# Patient Record
Sex: Female | Born: 1965 | Race: White | Hispanic: No
Health system: Southern US, Community
[De-identification: ages and names within clinical notes are randomized; demographics above are authoritative.]

---

## 1999-02-11 ENCOUNTER — Encounter: Payer: Self-pay | Admitting: Emergency Medicine

## 1999-02-11 ENCOUNTER — Emergency Department (HOSPITAL_COMMUNITY): Admission: EM | Admit: 1999-02-11 | Discharge: 1999-02-11 | Payer: Self-pay | Admitting: Emergency Medicine

## 2004-08-17 ENCOUNTER — Ambulatory Visit: Payer: Self-pay | Admitting: Obstetrics and Gynecology

## 2004-08-30 ENCOUNTER — Ambulatory Visit: Payer: Self-pay | Admitting: Obstetrics and Gynecology

## 2005-09-18 ENCOUNTER — Ambulatory Visit: Payer: Self-pay | Admitting: Obstetrics and Gynecology

## 2006-08-21 ENCOUNTER — Ambulatory Visit: Payer: Self-pay | Admitting: Gastroenterology

## 2006-10-02 ENCOUNTER — Ambulatory Visit: Payer: Self-pay | Admitting: Obstetrics and Gynecology

## 2006-10-08 ENCOUNTER — Ambulatory Visit: Payer: Self-pay | Admitting: Obstetrics and Gynecology

## 2007-10-06 ENCOUNTER — Ambulatory Visit: Payer: Self-pay | Admitting: Obstetrics and Gynecology

## 2008-10-06 ENCOUNTER — Ambulatory Visit: Payer: Self-pay | Admitting: Obstetrics and Gynecology

## 2009-10-19 ENCOUNTER — Ambulatory Visit: Payer: Self-pay | Admitting: Obstetrics and Gynecology

## 2010-12-05 ENCOUNTER — Ambulatory Visit: Payer: Self-pay | Admitting: Obstetrics and Gynecology

## 2011-12-06 ENCOUNTER — Ambulatory Visit: Payer: Self-pay | Admitting: Obstetrics and Gynecology

## 2012-12-09 ENCOUNTER — Ambulatory Visit: Payer: Self-pay | Admitting: Obstetrics and Gynecology

## 2014-01-07 ENCOUNTER — Ambulatory Visit: Payer: Self-pay | Admitting: Obstetrics and Gynecology

## 2014-11-15 ENCOUNTER — Other Ambulatory Visit: Payer: Self-pay | Admitting: Obstetrics and Gynecology

## 2014-11-15 DIAGNOSIS — Z1231 Encounter for screening mammogram for malignant neoplasm of breast: Secondary | ICD-10-CM

## 2014-11-15 DIAGNOSIS — Z13820 Encounter for screening for osteoporosis: Secondary | ICD-10-CM

## 2015-01-13 ENCOUNTER — Ambulatory Visit
Admission: RE | Admit: 2015-01-13 | Discharge: 2015-01-13 | Disposition: A | Discharge status: Home / Self Care | Payer: BLUE CROSS/BLUE SHIELD | Source: Ambulatory Visit | Attending: Obstetrics and Gynecology | Admitting: Obstetrics and Gynecology

## 2015-01-13 DIAGNOSIS — Z1231 Encounter for screening mammogram for malignant neoplasm of breast: Secondary | ICD-10-CM | POA: Insufficient documentation

## 2015-11-28 ENCOUNTER — Other Ambulatory Visit: Payer: Self-pay | Admitting: Obstetrics and Gynecology

## 2015-11-28 DIAGNOSIS — Z1231 Encounter for screening mammogram for malignant neoplasm of breast: Secondary | ICD-10-CM

## 2016-01-16 ENCOUNTER — Ambulatory Visit
Admission: RE | Admit: 2016-01-16 | Discharge: 2016-01-16 | Disposition: A | Discharge status: Home / Self Care | Payer: Managed Care, Other (non HMO) | Source: Ambulatory Visit | Attending: Obstetrics and Gynecology | Admitting: Obstetrics and Gynecology

## 2016-01-16 DIAGNOSIS — Z1231 Encounter for screening mammogram for malignant neoplasm of breast: Secondary | ICD-10-CM | POA: Diagnosis present

## 2016-11-25 ENCOUNTER — Other Ambulatory Visit: Payer: Self-pay | Admitting: Obstetrics and Gynecology

## 2016-11-25 DIAGNOSIS — Z1231 Encounter for screening mammogram for malignant neoplasm of breast: Secondary | ICD-10-CM

## 2016-11-25 DIAGNOSIS — Z13820 Encounter for screening for osteoporosis: Secondary | ICD-10-CM

## 2017-01-21 ENCOUNTER — Ambulatory Visit
Admission: RE | Admit: 2017-01-21 | Discharge: 2017-01-21 | Disposition: A | Discharge status: Home / Self Care | Payer: Managed Care, Other (non HMO) | Source: Ambulatory Visit | Attending: Obstetrics and Gynecology | Admitting: Obstetrics and Gynecology

## 2017-01-21 DIAGNOSIS — Z1231 Encounter for screening mammogram for malignant neoplasm of breast: Secondary | ICD-10-CM | POA: Diagnosis present

## 2017-12-02 ENCOUNTER — Other Ambulatory Visit: Payer: Self-pay | Admitting: Obstetrics and Gynecology

## 2017-12-02 DIAGNOSIS — Z1231 Encounter for screening mammogram for malignant neoplasm of breast: Secondary | ICD-10-CM

## 2017-12-02 DIAGNOSIS — Z13820 Encounter for screening for osteoporosis: Secondary | ICD-10-CM

## 2018-03-06 ENCOUNTER — Ambulatory Visit
Admission: RE | Admit: 2018-03-06 | Discharge: 2018-03-06 | Disposition: A | Discharge status: Home / Self Care | Payer: 59 | Source: Ambulatory Visit | Attending: Obstetrics and Gynecology | Admitting: Obstetrics and Gynecology

## 2018-03-06 DIAGNOSIS — Z1231 Encounter for screening mammogram for malignant neoplasm of breast: Secondary | ICD-10-CM | POA: Insufficient documentation

## 2019-02-09 ENCOUNTER — Other Ambulatory Visit: Payer: Self-pay | Admitting: Obstetrics and Gynecology

## 2019-02-09 DIAGNOSIS — Z1231 Encounter for screening mammogram for malignant neoplasm of breast: Secondary | ICD-10-CM

## 2019-03-09 ENCOUNTER — Ambulatory Visit
Admission: RE | Admit: 2019-03-09 | Discharge: 2019-03-09 | Disposition: A | Discharge status: Home / Self Care | Payer: Managed Care, Other (non HMO) | Source: Ambulatory Visit | Attending: Obstetrics and Gynecology | Admitting: Obstetrics and Gynecology

## 2019-03-09 DIAGNOSIS — Z1231 Encounter for screening mammogram for malignant neoplasm of breast: Secondary | ICD-10-CM | POA: Insufficient documentation

## 2020-02-04 ENCOUNTER — Other Ambulatory Visit: Payer: Self-pay | Admitting: Obstetrics and Gynecology

## 2020-02-04 DIAGNOSIS — Z1231 Encounter for screening mammogram for malignant neoplasm of breast: Secondary | ICD-10-CM

## 2020-03-09 ENCOUNTER — Ambulatory Visit
Admission: RE | Admit: 2020-03-09 | Discharge: 2020-03-09 | Disposition: A | Discharge status: Home / Self Care | Payer: Managed Care, Other (non HMO) | Source: Ambulatory Visit | Attending: Obstetrics and Gynecology | Admitting: Obstetrics and Gynecology

## 2020-03-09 ENCOUNTER — Other Ambulatory Visit: Payer: Self-pay

## 2020-03-09 DIAGNOSIS — Z1231 Encounter for screening mammogram for malignant neoplasm of breast: Secondary | ICD-10-CM | POA: Insufficient documentation

## 2020-05-19 ENCOUNTER — Other Ambulatory Visit: Payer: Self-pay | Admitting: Internal Medicine

## 2020-05-19 ENCOUNTER — Ambulatory Visit: Payer: BLUE CROSS/BLUE SHIELD | Attending: Internal Medicine

## 2020-05-19 DIAGNOSIS — Z23 Encounter for immunization: Secondary | ICD-10-CM

## 2020-05-19 NOTE — Progress Notes (Signed)
   Cov[facility] Vaccination [facility]  Name:  Shelley Hart    MRN: [id number] DOB: July 11, 1965  05/19/2020  [name] was observed post Covid-19 immunization for 15 minutes without incident. She was provided with Vaccine Information Sheet and instruction to access the V-Safe system.   [name] was instructed to call 911 with any severe reactions post vaccine: Marland Kitchen Difficulty breathing  . Swelling of face and throat  . A fast heartbeat  . A bad rash all over body  . Dizziness and weakness   Immunizations Administered    Name Date Dose VIS Date Route   Pfizer COVID-19 Vaccine 05/19/2020  9:51 AM 0.3 mL 02/09/2020 Intramuscular   Manufacturer: [employer], [employer]   Lot: [id number]   NDC: [address]-0

## 2020-11-03 IMAGING — MG DIGITAL SCREENING BILAT W/ TOMO W/ CAD
8 series · 8 of 24 positions shown · non-contrast
Comparison: Previous exam(s).

CLINICAL DATA: Screening.

EXAM:
DIGITAL SCREENING BILATERAL MAMMOGRAM WITH TOMO AND CAD

[L MLO synth-2D]
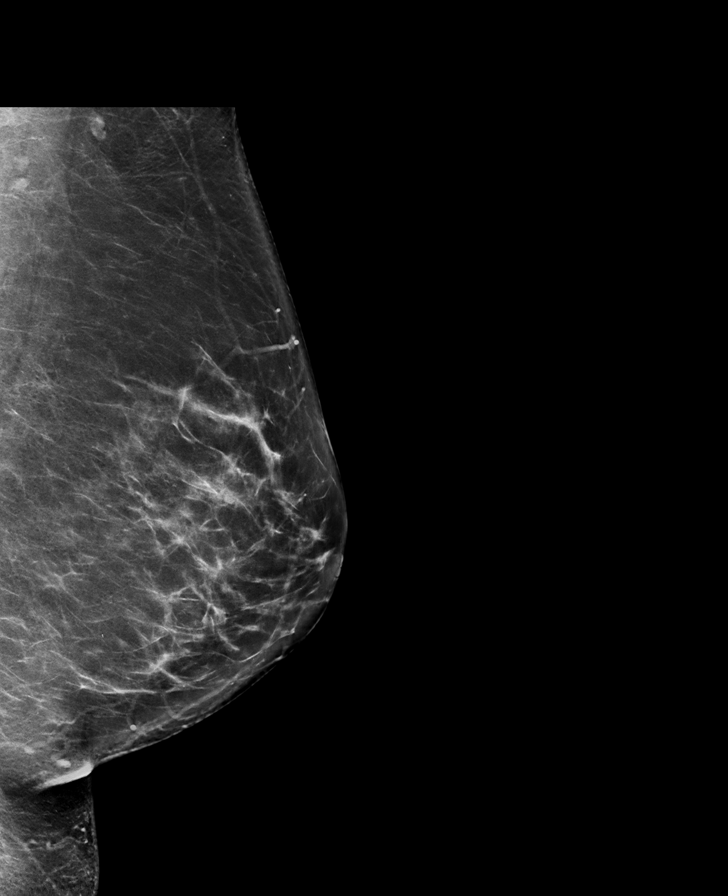

[L CC synth-2D]
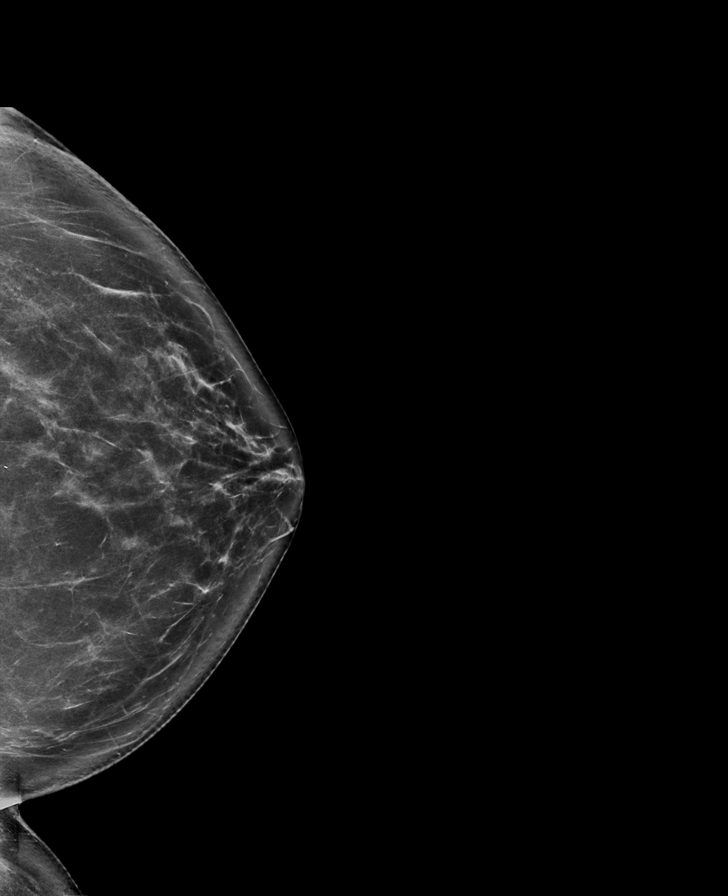

[R CC synth-2D]
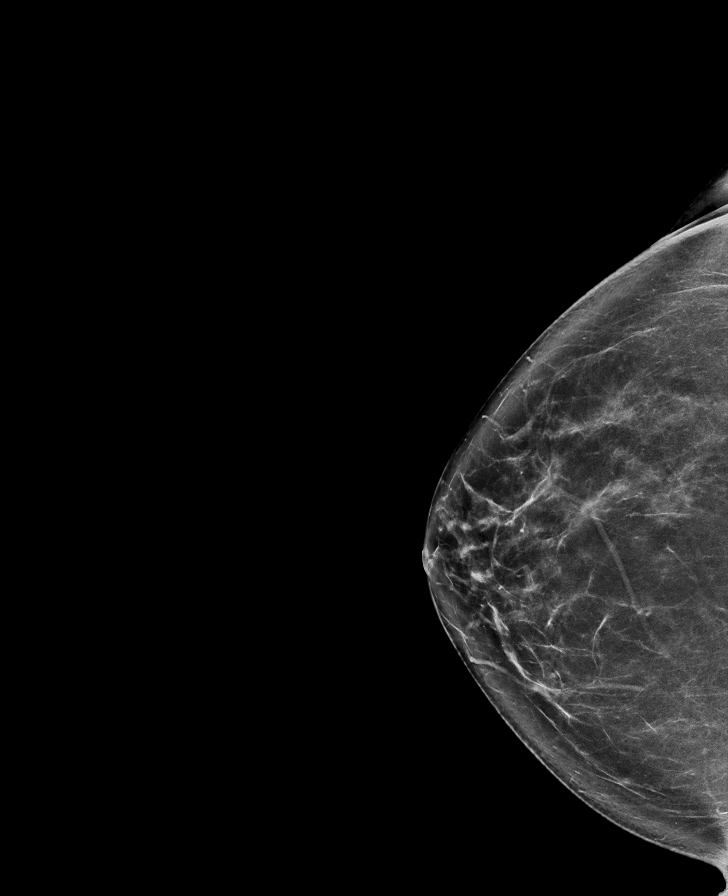

[R MLO synth-2D]
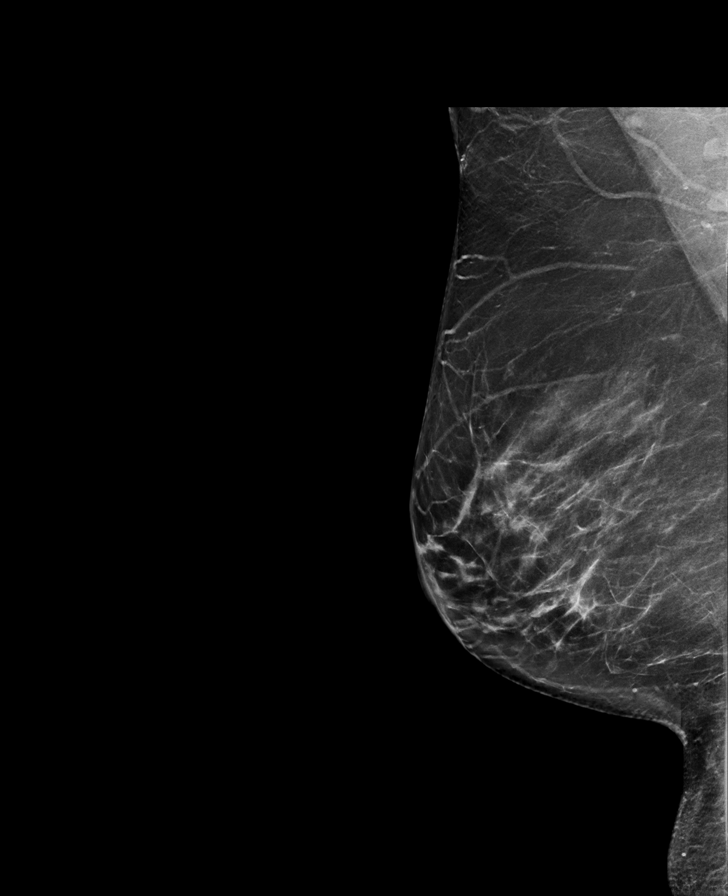

[L MLO tomo · tomo slice 45/89.0]
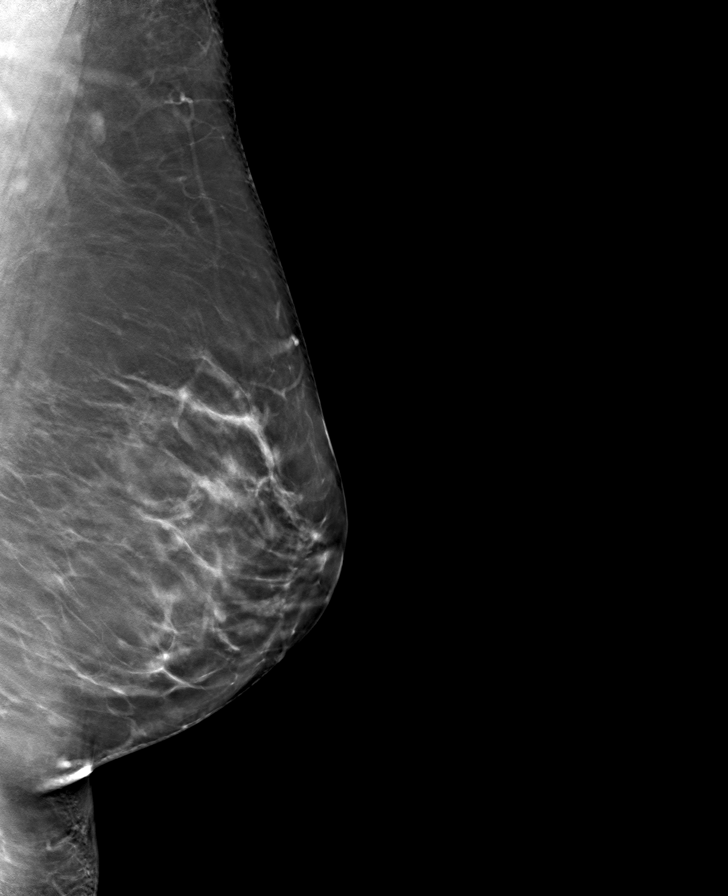

[R MLO tomo · tomo slice 44/87.0]
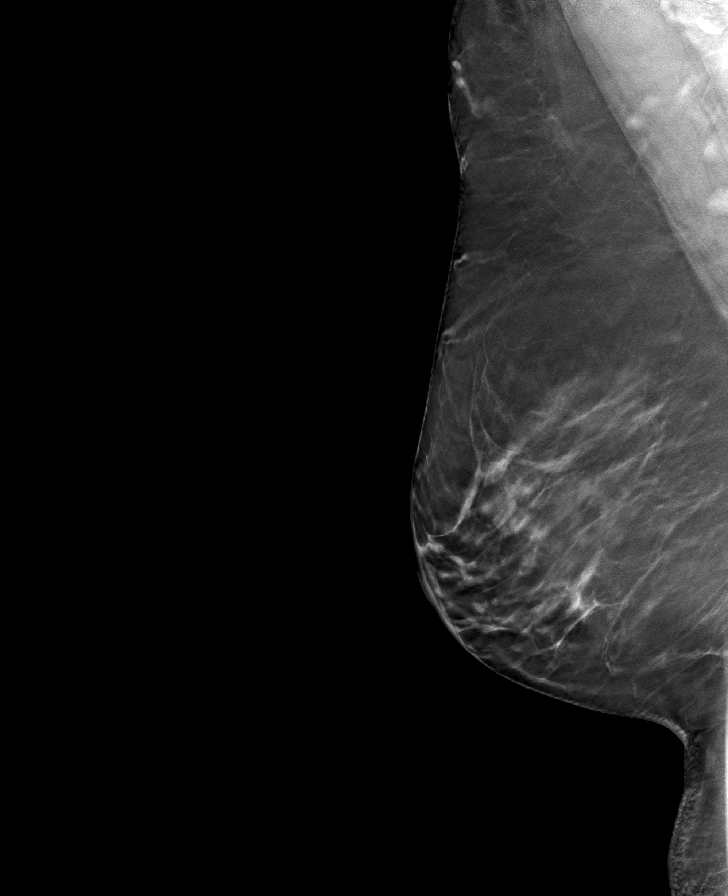

[R CC tomo · tomo slice 41/82.0]
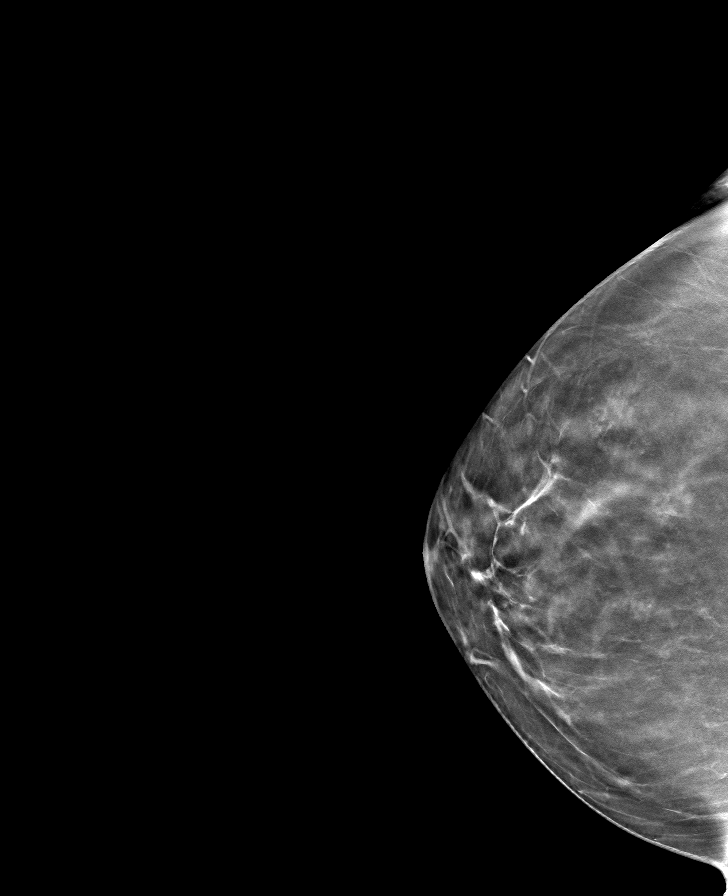

[L CC tomo · tomo slice 43/86.0]
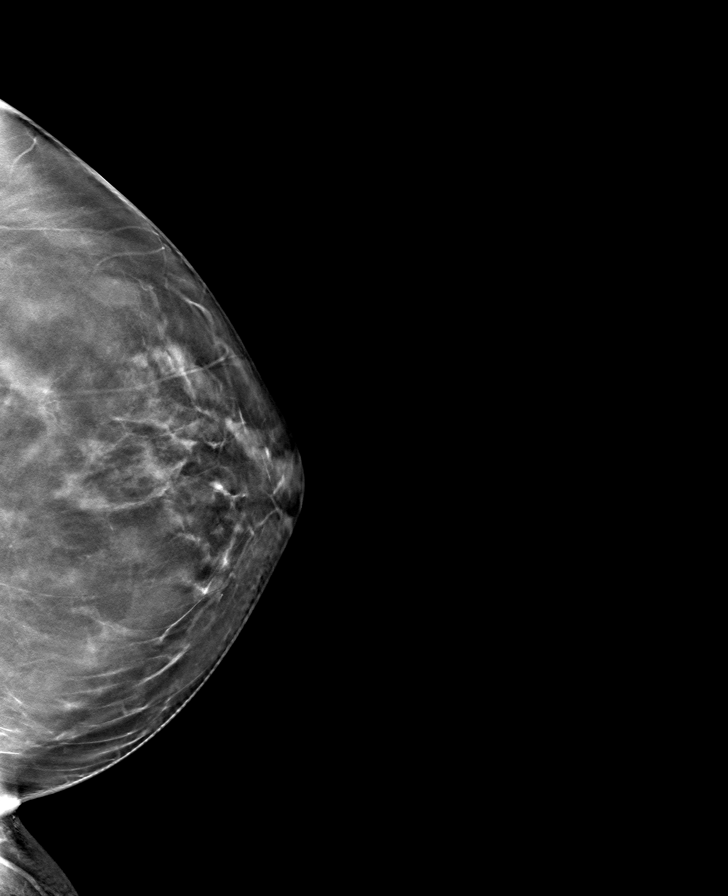

[8 of 24 positions shown; findings below may reference images not displayed]

ACR Breast Density Category b: There are scattered areas of
fibroglandular density.
FINDINGS: There are no findings suspicious for malignancy. Images were
processed with CAD.
IMPRESSION: No mammographic evidence of malignancy. A result letter of this
screening mammogram will be mailed directly to the patient.

RECOMMENDATION:
Screening mammogram in one year. (Code:[id number])

BI-RADS CATEGORY  1: Negative.

## 2021-02-08 ENCOUNTER — Other Ambulatory Visit: Payer: Self-pay | Admitting: Obstetrics and Gynecology

## 2021-02-08 DIAGNOSIS — Z1231 Encounter for screening mammogram for malignant neoplasm of breast: Secondary | ICD-10-CM

## 2021-03-13 ENCOUNTER — Ambulatory Visit
Admission: RE | Admit: 2021-03-13 | Discharge: 2021-03-13 | Disposition: A | Discharge status: Home / Self Care | Payer: Managed Care, Other (non HMO) | Source: Ambulatory Visit | Attending: Obstetrics and Gynecology | Admitting: Obstetrics and Gynecology

## 2021-03-13 ENCOUNTER — Other Ambulatory Visit: Payer: Self-pay

## 2021-03-13 DIAGNOSIS — Z1231 Encounter for screening mammogram for malignant neoplasm of breast: Secondary | ICD-10-CM | POA: Insufficient documentation

## 2022-02-12 ENCOUNTER — Other Ambulatory Visit: Payer: Self-pay | Admitting: Obstetrics and Gynecology

## 2022-02-12 DIAGNOSIS — Z1231 Encounter for screening mammogram for malignant neoplasm of breast: Secondary | ICD-10-CM

## 2022-03-21 ENCOUNTER — Ambulatory Visit
Admission: RE | Admit: 2022-03-21 | Discharge: 2022-03-21 | Disposition: A | Discharge status: Home / Self Care | Payer: Managed Care, Other (non HMO) | Source: Ambulatory Visit | Attending: Obstetrics and Gynecology | Admitting: Obstetrics and Gynecology

## 2022-03-21 DIAGNOSIS — Z1231 Encounter for screening mammogram for malignant neoplasm of breast: Secondary | ICD-10-CM | POA: Diagnosis not present

## 2023-02-19 ENCOUNTER — Other Ambulatory Visit: Payer: Self-pay | Admitting: Obstetrics and Gynecology

## 2023-02-19 DIAGNOSIS — Z1231 Encounter for screening mammogram for malignant neoplasm of breast: Secondary | ICD-10-CM

## 2023-03-26 ENCOUNTER — Ambulatory Visit
Admission: RE | Admit: 2023-03-26 | Discharge: 2023-03-26 | Disposition: A | Discharge status: Home / Self Care | Payer: Managed Care, Other (non HMO) | Source: Ambulatory Visit | Attending: Obstetrics and Gynecology | Admitting: Obstetrics and Gynecology

## 2023-03-26 DIAGNOSIS — Z1231 Encounter for screening mammogram for malignant neoplasm of breast: Secondary | ICD-10-CM | POA: Insufficient documentation

## 2024-01-12 ENCOUNTER — Other Ambulatory Visit (HOSPITAL_COMMUNITY): Payer: Self-pay

## 2024-03-11 ENCOUNTER — Other Ambulatory Visit: Payer: Self-pay | Admitting: Obstetrics and Gynecology

## 2024-03-11 DIAGNOSIS — Z1231 Encounter for screening mammogram for malignant neoplasm of breast: Secondary | ICD-10-CM

## 2024-03-30 ENCOUNTER — Ambulatory Visit
Admission: RE | Admit: 2024-03-30 | Discharge: 2024-03-30 | Disposition: A | Source: Ambulatory Visit | Attending: Obstetrics and Gynecology | Admitting: Obstetrics and Gynecology

## 2024-03-30 DIAGNOSIS — Z1231 Encounter for screening mammogram for malignant neoplasm of breast: Secondary | ICD-10-CM
